# Patient Record
Sex: Male | Born: 1985 | Race: Black or African American | Hispanic: No | Marital: Single | Smoking: Current some day smoker
Health system: Southern US, Community
[De-identification: ages and names within clinical notes are randomized; demographics above are authoritative.]

## PROBLEM LIST (undated history)

## (undated) DIAGNOSIS — T751XXA Unspecified effects of drowning and nonfatal submersion, initial encounter: Secondary | ICD-10-CM

## (undated) DIAGNOSIS — F70 Mild intellectual disabilities: Secondary | ICD-10-CM

## (undated) DIAGNOSIS — J45909 Unspecified asthma, uncomplicated: Secondary | ICD-10-CM

---
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
## 2008-01-07 ENCOUNTER — Emergency Department (HOSPITAL_COMMUNITY): Admission: EM | Admit: 2008-01-07 | Discharge: 2008-01-08 | Payer: Self-pay | Admitting: Emergency Medicine

## 2008-03-20 ENCOUNTER — Emergency Department (HOSPITAL_COMMUNITY): Admission: EM | Admit: 2008-03-20 | Discharge: 2008-03-20 | Payer: Self-pay | Admitting: Family Medicine

## 2008-03-21 ENCOUNTER — Emergency Department (HOSPITAL_COMMUNITY): Admission: EM | Admit: 2008-03-21 | Discharge: 2008-03-21 | Payer: Self-pay | Admitting: Emergency Medicine

## 2013-02-25 ENCOUNTER — Emergency Department (INDEPENDENT_AMBULATORY_CARE_PROVIDER_SITE_OTHER)
Admission: EM | Admit: 2013-02-25 | Discharge: 2013-02-25 | Disposition: A | Discharge status: Home / Self Care | Payer: Medicare Other | Source: Home / Self Care | Attending: Emergency Medicine | Admitting: Emergency Medicine

## 2013-02-25 ENCOUNTER — Encounter (HOSPITAL_COMMUNITY): Payer: Self-pay | Admitting: Emergency Medicine

## 2013-02-25 DIAGNOSIS — J020 Streptococcal pharyngitis: Secondary | ICD-10-CM

## 2013-02-25 HISTORY — DX: Unspecified asthma, uncomplicated: J45.909

## 2013-02-25 LAB — POCT RAPID STREP A: Streptococcus, Group A Screen (Direct): POSITIVE — AB

## 2013-02-25 MED ORDER — AMOXICILLIN 500 MG PO CAPS: 500.0000 mg | ORAL_CAPSULE | Freq: Three times a day (TID) | ORAL | Status: AC
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
## 2013-02-25 NOTE — ED Notes (Addendum)
Pt c/o sore throat onset 2 days... sxs include: odynophagia... Denies: f/v/n/d, cold sxs... He is alert and oriented w/no signs of acute distress and responding well.

## 2015-02-09 ENCOUNTER — Observation Stay (HOSPITAL_COMMUNITY): Payer: Medicare Other

## 2015-02-09 ENCOUNTER — Inpatient Hospital Stay (HOSPITAL_COMMUNITY)
Admission: EM | Admit: 2015-02-09 | Discharge: 2015-02-13 | DRG: 177 | Disposition: A | Discharge status: Home / Self Care | Payer: Medicare Other | Attending: Internal Medicine | Admitting: Internal Medicine

## 2015-02-09 ENCOUNTER — Encounter (HOSPITAL_COMMUNITY): Payer: Self-pay | Admitting: Emergency Medicine

## 2015-02-09 ENCOUNTER — Emergency Department (HOSPITAL_COMMUNITY): Payer: Medicare Other

## 2015-02-09 DIAGNOSIS — R945 Abnormal results of liver function studies: Secondary | ICD-10-CM | POA: Diagnosis present

## 2015-02-09 DIAGNOSIS — T751XXA Unspecified effects of drowning and nonfatal submersion, initial encounter: Secondary | ICD-10-CM | POA: Diagnosis not present

## 2015-02-09 DIAGNOSIS — F17210 Nicotine dependence, cigarettes, uncomplicated: Secondary | ICD-10-CM | POA: Diagnosis present

## 2015-02-09 DIAGNOSIS — R931 Abnormal findings on diagnostic imaging of heart and coronary circulation: Secondary | ICD-10-CM | POA: Diagnosis present

## 2015-02-09 DIAGNOSIS — I469 Cardiac arrest, cause unspecified: Secondary | ICD-10-CM | POA: Diagnosis not present

## 2015-02-09 DIAGNOSIS — I468 Cardiac arrest due to other underlying condition: Secondary | ICD-10-CM | POA: Diagnosis present

## 2015-02-09 DIAGNOSIS — J690 Pneumonitis due to inhalation of food and vomit: Secondary | ICD-10-CM | POA: Diagnosis not present

## 2015-02-09 DIAGNOSIS — R0902 Hypoxemia: Secondary | ICD-10-CM

## 2015-02-09 DIAGNOSIS — J698 Pneumonitis due to inhalation of other solids and liquids: Principal | ICD-10-CM | POA: Diagnosis present

## 2015-02-09 DIAGNOSIS — K7689 Other specified diseases of liver: Secondary | ICD-10-CM | POA: Diagnosis not present

## 2015-02-09 DIAGNOSIS — R0602 Shortness of breath: Secondary | ICD-10-CM

## 2015-02-09 DIAGNOSIS — D72829 Elevated white blood cell count, unspecified: Secondary | ICD-10-CM | POA: Diagnosis present

## 2015-02-09 DIAGNOSIS — N179 Acute kidney failure, unspecified: Secondary | ICD-10-CM

## 2015-02-09 DIAGNOSIS — Y92830 Public park as the place of occurrence of the external cause: Secondary | ICD-10-CM

## 2015-02-09 DIAGNOSIS — I959 Hypotension, unspecified: Secondary | ICD-10-CM | POA: Diagnosis present

## 2015-02-09 DIAGNOSIS — K760 Fatty (change of) liver, not elsewhere classified: Secondary | ICD-10-CM | POA: Diagnosis present

## 2015-02-09 DIAGNOSIS — J45909 Unspecified asthma, uncomplicated: Secondary | ICD-10-CM | POA: Diagnosis present

## 2015-02-09 HISTORY — DX: Mild intellectual disabilities: F70

## 2015-02-09 HISTORY — DX: Unspecified effects of drowning and nonfatal submersion, initial encounter: T75.1XXA

## 2015-02-09 LAB — CBC WITH DIFFERENTIAL/PLATELET
BASOS ABS: 0 10*3/uL (ref 0.0–0.1)
BLASTS: 0 %
Band Neutrophils: 0 % (ref 0–10)
Basophils Relative: 0 % (ref 0–1)
EOS ABS: 0 10*3/uL (ref 0.0–0.7)
Eosinophils Relative: 0 % (ref 0–5)
HCT: 42.6 % (ref 39.0–52.0)
HEMOGLOBIN: 14.2 g/dL (ref 13.0–17.0)
Lymphocytes Relative: 52 % — ABNORMAL HIGH (ref 12–46)
Lymphs Abs: 6.9 10*3/uL — ABNORMAL HIGH (ref 0.7–4.0)
MCH: 26.6 pg (ref 26.0–34.0)
MCHC: 33.3 g/dL (ref 30.0–36.0)
MCV: 79.8 fL (ref 78.0–100.0)
METAMYELOCYTES PCT: 0 %
MONO ABS: 0.5 10*3/uL (ref 0.1–1.0)
MYELOCYTES: 0 %
Monocytes Relative: 4 % (ref 3–12)
Neutro Abs: 5.9 10*3/uL (ref 1.7–7.7)
Neutrophils Relative %: 44 % (ref 43–77)
Other: 0 %
Platelets: 277 10*3/uL (ref 150–400)
Promyelocytes Absolute: 0 %
RBC: 5.34 MIL/uL (ref 4.22–5.81)
RDW: 13 % (ref 11.5–15.5)
WBC: 13.3 10*3/uL — ABNORMAL HIGH (ref 4.0–10.5)
nRBC: 0 /100 WBC

## 2015-02-09 LAB — COMPREHENSIVE METABOLIC PANEL
ALBUMIN: 3.8 g/dL (ref 3.5–5.0)
ALT: 81 U/L — AB (ref 17–63)
AST: 91 U/L — AB (ref 15–41)
Alkaline Phosphatase: 87 U/L (ref 38–126)
Anion gap: 12 (ref 5–15)
BUN: 14 mg/dL (ref 6–20)
CALCIUM: 9.1 mg/dL (ref 8.9–10.3)
CO2: 22 mmol/L (ref 22–32)
CREATININE: 1.4 mg/dL — AB (ref 0.61–1.24)
Chloride: 106 mmol/L (ref 101–111)
GFR calc non Af Amer: >60 mL/min (ref >60)
Glucose, Bld: 147 mg/dL — ABNORMAL HIGH (ref 65–99)
Potassium: 3.5 mmol/L (ref 3.5–5.1)
Sodium: 140 mmol/L (ref 135–145)
TOTAL PROTEIN: 7.1 g/dL (ref 6.5–8.1)
Total Bilirubin: 0.7 mg/dL (ref 0.3–1.2)

## 2015-02-09 LAB — I-STAT CHEM 8, ED
BUN: 19 mg/dL (ref 6–20)
CALCIUM ION: 1.18 mmol/L (ref 1.12–1.23)
Chloride: 102 mmol/L (ref 101–111)
Creatinine, Ser: 1.3 mg/dL — ABNORMAL HIGH (ref 0.61–1.24)
Glucose, Bld: 150 mg/dL — ABNORMAL HIGH (ref 65–99)
HCT: 47 % (ref 39.0–52.0)
Hemoglobin: 16 g/dL (ref 13.0–17.0)
POTASSIUM: 3.4 mmol/L — AB (ref 3.5–5.1)
Sodium: 142 mmol/L (ref 135–145)
TCO2: 20 mmol/L (ref 0–100)

## 2015-02-09 LAB — URINE MICROSCOPIC-ADD ON

## 2015-02-09 LAB — URINALYSIS, ROUTINE W REFLEX MICROSCOPIC
Bilirubin Urine: NEGATIVE
Glucose, UA: >1000 mg/dL — AB
KETONES UR: NEGATIVE mg/dL
LEUKOCYTES UA: NEGATIVE
Nitrite: NEGATIVE
PROTEIN: 100 mg/dL — AB
Specific Gravity, Urine: 1.031 — ABNORMAL HIGH (ref 1.005–1.030)
Urobilinogen, UA: 0.2 mg/dL (ref 0.0–1.0)
pH: 5 (ref 5.0–8.0)

## 2015-02-09 LAB — I-STAT CG4 LACTIC ACID, ED
LACTIC ACID, VENOUS: 5.5 mmol/L — AB (ref 0.5–2.0)
Lactic Acid, Venous: 6.08 mmol/L (ref 0.5–2.0)

## 2015-02-09 LAB — I-STAT TROPONIN, ED: Troponin i, poc: 0 ng/mL (ref 0.00–0.08)

## 2015-02-09 LAB — SODIUM, URINE, RANDOM: SODIUM UR: 170 mmol/L

## 2015-02-09 MED ORDER — SODIUM CHLORIDE 0.9 % IJ SOLN
3.0000 mL | Freq: Two times a day (BID) | INTRAMUSCULAR | Status: DC
  Administered 2015-02-09 – 2015-02-11 (×2): 3 mL via INTRAVENOUS

## 2015-02-09 MED ORDER — ACETAMINOPHEN 650 MG RE SUPP: 650.0000 mg | Freq: Four times a day (QID) | RECTAL | Status: DC | PRN

## 2015-02-09 MED ORDER — ALBUTEROL SULFATE (2.5 MG/3ML) 0.083% IN NEBU
2.5000 mg | INHALATION_SOLUTION | Freq: Four times a day (QID) | RESPIRATORY_TRACT | Status: DC | PRN
Start: 2015-02-09 — End: 2015-02-13
  Administered 2015-02-09: 2.5 mg via RESPIRATORY_TRACT
  Filled 2015-02-09: qty 3

## 2015-02-09 MED ORDER — SODIUM CHLORIDE 0.9 % IV SOLN
INTRAVENOUS | Status: AC
Start: 2015-02-09 — End: 2015-02-10
  Administered 2015-02-09: 22:00:00 via INTRAVENOUS

## 2015-02-09 MED ORDER — SODIUM CHLORIDE 0.9 % IJ SOLN: 3.0000 mL | INTRAMUSCULAR | Status: DC | PRN

## 2015-02-09 MED ORDER — PIPERACILLIN-TAZOBACTAM 4.5 G IVPB: 4.5000 g | Freq: Three times a day (TID) | INTRAVENOUS | Status: DC

## 2015-02-09 MED ORDER — ENOXAPARIN SODIUM 40 MG/0.4ML ~~LOC~~ SOLN
40.0000 mg | Freq: Every day | SUBCUTANEOUS | Status: DC
  Administered 2015-02-10 – 2015-02-13 (×4): 40 mg via SUBCUTANEOUS
  Filled 2015-02-09 (×5): qty 0.4

## 2015-02-09 MED ORDER — SODIUM CHLORIDE 0.9 % IV SOLN
250.0000 mL | INTRAVENOUS | Status: DC | PRN
  Administered 2015-02-10: 250 mL via INTRAVENOUS

## 2015-02-09 MED ORDER — PIPERACILLIN-TAZOBACTAM 3.375 G IVPB
3.3750 g | Freq: Three times a day (TID) | INTRAVENOUS | Status: DC
  Administered 2015-02-09 – 2015-02-13 (×11): 3.375 g via INTRAVENOUS
  Filled 2015-02-09 (×13): qty 50

## 2015-02-09 MED ORDER — ACETAMINOPHEN 325 MG PO TABS
650.0000 mg | ORAL_TABLET | Freq: Four times a day (QID) | ORAL | Status: DC | PRN
  Administered 2015-02-10: 650 mg via ORAL
  Filled 2015-02-09: qty 2

## 2015-02-09 MED ORDER — SODIUM CHLORIDE 0.9 % IJ SOLN
3.0000 mL | Freq: Two times a day (BID) | INTRAMUSCULAR | Status: DC
  Administered 2015-02-09 – 2015-02-11 (×3): 3 mL via INTRAVENOUS

## 2015-02-09 MED ORDER — IPRATROPIUM-ALBUTEROL 0.5-2.5 (3) MG/3ML IN SOLN
3.0000 mL | Freq: Once | RESPIRATORY_TRACT | Status: AC
  Administered 2015-02-09: 3 mL via RESPIRATORY_TRACT
  Filled 2015-02-09: qty 3

## 2015-02-09 NOTE — ED Notes (Signed)
Pt transferred to hospital bed at this time.  
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
## 2015-02-09 NOTE — ED Notes (Signed)
Admitting MD at BS.  

## 2015-02-09 NOTE — ED Notes (Signed)
XRAY called and informed pt is ready for XRAY.  
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
## 2015-02-09 NOTE — ED Notes (Signed)
Pt attempting to give urine sample
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
## 2015-02-10 ENCOUNTER — Observation Stay (HOSPITAL_COMMUNITY): Payer: Medicare Other

## 2015-02-10 ENCOUNTER — Inpatient Hospital Stay (HOSPITAL_COMMUNITY): Payer: Medicare Other

## 2015-02-10 ENCOUNTER — Encounter (HOSPITAL_COMMUNITY): Payer: Self-pay | Admitting: General Practice

## 2015-02-10 DIAGNOSIS — N179 Acute kidney failure, unspecified: Secondary | ICD-10-CM | POA: Diagnosis not present

## 2015-02-10 DIAGNOSIS — K760 Fatty (change of) liver, not elsewhere classified: Secondary | ICD-10-CM | POA: Diagnosis present

## 2015-02-10 DIAGNOSIS — T751XXD Unspecified effects of drowning and nonfatal submersion, subsequent encounter: Secondary | ICD-10-CM

## 2015-02-10 DIAGNOSIS — I469 Cardiac arrest, cause unspecified: Secondary | ICD-10-CM

## 2015-02-10 DIAGNOSIS — I959 Hypotension, unspecified: Secondary | ICD-10-CM | POA: Diagnosis present

## 2015-02-10 DIAGNOSIS — R0902 Hypoxemia: Secondary | ICD-10-CM

## 2015-02-10 DIAGNOSIS — F17210 Nicotine dependence, cigarettes, uncomplicated: Secondary | ICD-10-CM | POA: Diagnosis present

## 2015-02-10 DIAGNOSIS — J45909 Unspecified asthma, uncomplicated: Secondary | ICD-10-CM | POA: Diagnosis present

## 2015-02-10 DIAGNOSIS — I468 Cardiac arrest due to other underlying condition: Secondary | ICD-10-CM | POA: Diagnosis present

## 2015-02-10 DIAGNOSIS — Y92830 Public park as the place of occurrence of the external cause: Secondary | ICD-10-CM | POA: Diagnosis not present

## 2015-02-10 DIAGNOSIS — D72829 Elevated white blood cell count, unspecified: Secondary | ICD-10-CM | POA: Diagnosis present

## 2015-02-10 DIAGNOSIS — R931 Abnormal findings on diagnostic imaging of heart and coronary circulation: Secondary | ICD-10-CM | POA: Diagnosis not present

## 2015-02-10 DIAGNOSIS — T751XXA Unspecified effects of drowning and nonfatal submersion, initial encounter: Secondary | ICD-10-CM | POA: Diagnosis present

## 2015-02-10 DIAGNOSIS — K7689 Other specified diseases of liver: Secondary | ICD-10-CM | POA: Diagnosis not present

## 2015-02-10 DIAGNOSIS — J698 Pneumonitis due to inhalation of other solids and liquids: Secondary | ICD-10-CM | POA: Diagnosis present

## 2015-02-10 DIAGNOSIS — R7989 Other specified abnormal findings of blood chemistry: Secondary | ICD-10-CM | POA: Diagnosis not present

## 2015-02-10 DIAGNOSIS — J690 Pneumonitis due to inhalation of food and vomit: Secondary | ICD-10-CM | POA: Diagnosis not present

## 2015-02-10 LAB — CBC WITH DIFFERENTIAL/PLATELET
BASOS PCT: 0 % (ref 0–1)
Basophils Absolute: 0 10*3/uL (ref 0.0–0.1)
Eosinophils Absolute: 0 10*3/uL (ref 0.0–0.7)
Eosinophils Relative: 0 % (ref 0–5)
HCT: 43 % (ref 39.0–52.0)
HEMOGLOBIN: 14.4 g/dL (ref 13.0–17.0)
Lymphocytes Relative: 5 % — ABNORMAL LOW (ref 12–46)
Lymphs Abs: 1.5 10*3/uL (ref 0.7–4.0)
MCH: 26.7 pg (ref 26.0–34.0)
MCHC: 33.5 g/dL (ref 30.0–36.0)
MCV: 79.8 fL (ref 78.0–100.0)
MONO ABS: 1.1 10*3/uL — AB (ref 0.1–1.0)
Monocytes Relative: 4 % (ref 3–12)
NEUTROS ABS: 24.7 10*3/uL — AB (ref 1.7–7.7)
NEUTROS PCT: 91 % — AB (ref 43–77)
Platelets: UNDETERMINED 10*3/uL (ref 150–400)
RBC: 5.39 MIL/uL (ref 4.22–5.81)
RDW: 13.4 % (ref 11.5–15.5)
WBC: 27.2 10*3/uL — ABNORMAL HIGH (ref 4.0–10.5)

## 2015-02-10 LAB — RAPID URINE DRUG SCREEN, HOSP PERFORMED
Amphetamines: NOT DETECTED
Barbiturates: NOT DETECTED
Benzodiazepines: NOT DETECTED
Cocaine: NOT DETECTED
Opiates: NOT DETECTED
TETRAHYDROCANNABINOL: NOT DETECTED

## 2015-02-10 LAB — COMPREHENSIVE METABOLIC PANEL
ALBUMIN: 3.6 g/dL (ref 3.5–5.0)
ALK PHOS: 67 U/L (ref 38–126)
ALT: 82 U/L — AB (ref 17–63)
AST: 77 U/L — ABNORMAL HIGH (ref 15–41)
Anion gap: 11 (ref 5–15)
BUN: 12 mg/dL (ref 6–20)
CHLORIDE: 104 mmol/L (ref 101–111)
CO2: 24 mmol/L (ref 22–32)
CREATININE: 1.35 mg/dL — AB (ref 0.61–1.24)
Calcium: 9 mg/dL (ref 8.9–10.3)
GFR calc Af Amer: >60 mL/min (ref >60)
Glucose, Bld: 122 mg/dL — ABNORMAL HIGH (ref 65–99)
Potassium: 4.4 mmol/L (ref 3.5–5.1)
SODIUM: 139 mmol/L (ref 135–145)
TOTAL PROTEIN: 6.9 g/dL (ref 6.5–8.1)
Total Bilirubin: 0.8 mg/dL (ref 0.3–1.2)

## 2015-02-10 LAB — I-STAT ARTERIAL BLOOD GAS, ED
Bicarbonate: 25.8 mEq/L — ABNORMAL HIGH (ref 20.0–24.0)
O2 Saturation: 90 %
PCO2 ART: 45.9 mmHg — AB (ref 35.0–45.0)
TCO2: 27 mmol/L (ref 0–100)
pH, Arterial: 7.357 (ref 7.350–7.450)
pO2, Arterial: 61 mmHg — ABNORMAL LOW (ref 80.0–100.0)

## 2015-02-10 LAB — TROPONIN I
TROPONIN I: 0.06 ng/mL — AB (ref <0.031)
Troponin I: 0.05 ng/mL — ABNORMAL HIGH (ref <0.031)

## 2015-02-10 LAB — MRSA PCR SCREENING: MRSA by PCR: NEGATIVE

## 2015-02-10 LAB — BRAIN NATRIURETIC PEPTIDE: B Natriuretic Peptide: 32.2 pg/mL (ref 0.0–100.0)

## 2015-02-10 LAB — PATHOLOGIST SMEAR REVIEW: Path Review: REACTIVE

## 2015-02-10 LAB — ETHANOL

## 2015-02-10 MED ORDER — ONDANSETRON HCL 4 MG/2ML IJ SOLN
4.0000 mg | Freq: Four times a day (QID) | INTRAMUSCULAR | Status: DC | PRN
Start: 2015-02-10 — End: 2015-02-13

## 2015-02-10 MED ORDER — IBUPROFEN 800 MG PO TABS
800.0000 mg | ORAL_TABLET | Freq: Three times a day (TID) | ORAL | Status: DC
  Administered 2015-02-10 – 2015-02-13 (×9): 800 mg via ORAL
  Filled 2015-02-10 (×9): qty 1

## 2015-02-10 MED ORDER — PANTOPRAZOLE SODIUM 40 MG PO TBEC
40.0000 mg | DELAYED_RELEASE_TABLET | Freq: Every day | ORAL | Status: DC
  Administered 2015-02-10 – 2015-02-13 (×4): 40 mg via ORAL
  Filled 2015-02-10 (×4): qty 1

## 2015-02-10 MED ORDER — PNEUMOCOCCAL VAC POLYVALENT 25 MCG/0.5ML IJ INJ
0.5000 mL | INJECTION | INTRAMUSCULAR | Status: AC
  Administered 2015-02-11: 0.5 mL via INTRAMUSCULAR
  Filled 2015-02-10: qty 0.5
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
## 2015-02-10 NOTE — ED Notes (Signed)
Talked to patient's physician with concern about patient's tachypnea and high oxygen demand.  Patient's RR is 37-38 and are somewhat shallow, lung sounds are diminished.  RT has been contacted as well.  Chest xray and ABG order.
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
## 2015-02-10 NOTE — Progress Notes (Signed)
EEG completed; results pending.    
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
## 2015-02-10 NOTE — ED Notes (Signed)
Physician at bedside.

## 2015-02-10 NOTE — ED Provider Notes (Signed)
AM MD update: Patient sitting upright, speaking clearly, describing chest tightness. O2- 98% w [place], abnormal.  He is awaiting bed placement.   Gerhard Munchobert Dorene Bruni, MD 02/10/15 [phone]

## 2015-02-10 NOTE — ED Notes (Signed)
Pt had two episodes of emesis with increased HR in 130s. Dr. Selena BattenKim paged for antiemetic.

## 2015-02-10 NOTE — ED Notes (Signed)
Patient will be transferred from POD D34 to POD C 29. Phlebotomy and respiratory at bedside. Will be transported to x-ray then POD C.

## 2015-02-11 ENCOUNTER — Other Ambulatory Visit: Payer: Self-pay | Admitting: Nurse Practitioner

## 2015-02-11 ENCOUNTER — Inpatient Hospital Stay (HOSPITAL_COMMUNITY): Payer: Medicare Other

## 2015-02-11 ENCOUNTER — Encounter (HOSPITAL_COMMUNITY): Payer: Self-pay | Admitting: Nurse Practitioner

## 2015-02-11 DIAGNOSIS — R931 Abnormal findings on diagnostic imaging of heart and coronary circulation: Secondary | ICD-10-CM

## 2015-02-11 DIAGNOSIS — I469 Cardiac arrest, cause unspecified: Secondary | ICD-10-CM

## 2015-02-11 DIAGNOSIS — I420 Dilated cardiomyopathy: Secondary | ICD-10-CM

## 2015-02-11 DIAGNOSIS — R0902 Hypoxemia: Secondary | ICD-10-CM | POA: Diagnosis present

## 2015-02-11 DIAGNOSIS — R7989 Other specified abnormal findings of blood chemistry: Secondary | ICD-10-CM

## 2015-02-11 DIAGNOSIS — J698 Pneumonitis due to inhalation of other solids and liquids: Secondary | ICD-10-CM | POA: Diagnosis not present

## 2015-02-11 LAB — CALCIUM / CREATININE RATIO, URINE
CALCIUM/CREAT. RATIO: 48 mg/g{creat} (ref 0–260)
Calcium, Ur: 12.5 mg/dL
Creatinine, Urine: 260.1 mg/dL

## 2015-02-11 LAB — CBC WITH DIFFERENTIAL/PLATELET
Basophils Absolute: 0 10*3/uL (ref 0.0–0.1)
Basophils Relative: 0 % (ref 0–1)
Eosinophils Absolute: 0.1 10*3/uL (ref 0.0–0.7)
Eosinophils Relative: 1 % (ref 0–5)
HCT: 37.1 % — ABNORMAL LOW (ref 39.0–52.0)
Hemoglobin: 12.5 g/dL — ABNORMAL LOW (ref 13.0–17.0)
LYMPHS ABS: 3.4 10*3/uL (ref 0.7–4.0)
Lymphocytes Relative: 25 % (ref 12–46)
MCH: 27.2 pg (ref 26.0–34.0)
MCHC: 33.7 g/dL (ref 30.0–36.0)
MCV: 80.7 fL (ref 78.0–100.0)
MONOS PCT: 8 % (ref 3–12)
Monocytes Absolute: 1.1 10*3/uL — ABNORMAL HIGH (ref 0.1–1.0)
Neutro Abs: 9.2 10*3/uL — ABNORMAL HIGH (ref 1.7–7.7)
Neutrophils Relative %: 66 % (ref 43–77)
Platelets: 232 10*3/uL (ref 150–400)
RBC: 4.6 MIL/uL (ref 4.22–5.81)
RDW: 13.6 % (ref 11.5–15.5)
WBC: 13.9 10*3/uL — ABNORMAL HIGH (ref 4.0–10.5)

## 2015-02-11 LAB — COMPREHENSIVE METABOLIC PANEL
ALT: 61 U/L (ref 17–63)
AST: 67 U/L — ABNORMAL HIGH (ref 15–41)
Albumin: 3.4 g/dL — ABNORMAL LOW (ref 3.5–5.0)
Alkaline Phosphatase: 56 U/L (ref 38–126)
Anion gap: 9 (ref 5–15)
BUN: 8 mg/dL (ref 6–20)
CO2: 28 mmol/L (ref 22–32)
CREATININE: 1.19 mg/dL (ref 0.61–1.24)
Calcium: 8.6 mg/dL — ABNORMAL LOW (ref 8.9–10.3)
Chloride: 99 mmol/L — ABNORMAL LOW (ref 101–111)
GFR calc Af Amer: >60 mL/min (ref >60)
GFR calc non Af Amer: >60 mL/min (ref >60)
Glucose, Bld: 106 mg/dL — ABNORMAL HIGH (ref 65–99)
Potassium: 3.3 mmol/L — ABNORMAL LOW (ref 3.5–5.1)
SODIUM: 136 mmol/L (ref 135–145)
TOTAL PROTEIN: 6.9 g/dL (ref 6.5–8.1)
Total Bilirubin: 1.5 mg/dL — ABNORMAL HIGH (ref 0.3–1.2)

## 2015-02-11 MED ORDER — PERFLUTREN LIPID MICROSPHERE
1.0000 mL | INTRAVENOUS | Status: AC | PRN
  Administered 2015-02-11: 3 mL via INTRAVENOUS
  Filled 2015-02-11: qty 10

## 2015-02-11 MED ORDER — POTASSIUM CHLORIDE CRYS ER 20 MEQ PO TBCR
40.0000 meq | EXTENDED_RELEASE_TABLET | ORAL | Status: AC
  Administered 2015-02-11 (×2): 40 meq via ORAL
  Filled 2015-02-11 (×2): qty 2

## 2015-02-11 MED ORDER — FUROSEMIDE 10 MG/ML IJ SOLN
20.0000 mg | Freq: Once | INTRAMUSCULAR | Status: AC
  Administered 2015-02-11: 20 mg via INTRAVENOUS
  Filled 2015-02-11: qty 2

## 2015-02-11 NOTE — Clinical Documentation Improvement (Signed)
Possible Clinical Conditions?  Acute Respiratory Failure Acute on Chronic Respiratory Failure Chronic Respiratory Failure Acute Respiratory Insufficiency Acute Respiratory Insufficiency following surgery or trauma Other Condition Cannot Clinically Determine   Supporting Information:(As per notes) "Patient was noted to be hypoxic with frequent coughing and on presentation to the ED his sats were 70-75%. "  "RN tried to wean pt off of 02 throughout the shift. Pt stayed on RA for 5 minutes, Sats dropped to 90%. RN replaced 0@," Respiratory:"He has rales. Slight crackles right mid lung "  Treatment: Oxygen via Non-Rebreather Mask  Medications:    albuterol (PROVENTIL) (2.5 MG/3ML) 0.083% nebulizer solution 2.5 mg   [[id number]][[id number]]       Ordered Dose: 2.5 mg Route: Nebulization Frequency: Every 6 hours PRN   albuterol (PROVENTIL HFA;VENTOLIN HFA) 108 (90 BASE) MCG/ACT inhaler [[id number]][[id number]]        Order Details      Dose: 1-2 puff Route: Inhalation Frequency: Every 6 hours PRN     Thank You, Nevin BloodgoodJoan B Simrit Gohlke, RN, BSN, CCDS,Clinical Documentation Specialist:  [phone]  [phone]=Cell [place]- Health Information Management
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
## 2015-02-11 NOTE — Progress Notes (Signed)
  Echocardiogram 2D Echocardiogram has been performed.  Victor [name] [name] 02/11/2015, 9:23 AM
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
## 2015-02-11 NOTE — Progress Notes (Signed)
SATURATION QUALIFICATIONS: (This note is used to comply with regulatory documentation for home oxygen)  Patient Saturations on Room Air at Rest = 85%  Patient Saturations on Room Air while Ambulating = 84%  Patient Saturations on 2 Liters of oxygen while Ambulating = 5%  Please briefly explain why patient needs home oxygen:Ambulating pt without oxygen and sats down to 84% on RA but improved to the mid to upper 90s on 2l [place]

## 2015-02-12 LAB — COMPREHENSIVE METABOLIC PANEL
ALT: 49 U/L (ref 17–63)
ANION GAP: 8 (ref 5–15)
AST: 54 U/L — ABNORMAL HIGH (ref 15–41)
Albumin: 3.4 g/dL — ABNORMAL LOW (ref 3.5–5.0)
Alkaline Phosphatase: 61 U/L (ref 38–126)
BUN: 8 mg/dL (ref 6–20)
CALCIUM: 8.7 mg/dL — AB (ref 8.9–10.3)
CO2: 29 mmol/L (ref 22–32)
Chloride: 99 mmol/L — ABNORMAL LOW (ref 101–111)
Creatinine, Ser: 1.08 mg/dL (ref 0.61–1.24)
GFR calc Af Amer: >60 mL/min (ref >60)
GLUCOSE: 111 mg/dL — AB (ref 65–99)
Potassium: 3.5 mmol/L (ref 3.5–5.1)
Sodium: 136 mmol/L (ref 135–145)
Total Bilirubin: 0.8 mg/dL (ref 0.3–1.2)
Total Protein: 6.8 g/dL (ref 6.5–8.1)

## 2015-02-12 LAB — CBC WITH DIFFERENTIAL/PLATELET
Basophils Absolute: 0 10*3/uL (ref 0.0–0.1)
Basophils Relative: 0 % (ref 0–1)
EOS PCT: 2 % (ref 0–5)
Eosinophils Absolute: 0.2 10*3/uL (ref 0.0–0.7)
HCT: 37.2 % — ABNORMAL LOW (ref 39.0–52.0)
HEMOGLOBIN: 12.1 g/dL — AB (ref 13.0–17.0)
LYMPHS PCT: 25 % (ref 12–46)
Lymphs Abs: 3 10*3/uL (ref 0.7–4.0)
MCH: 26.2 pg (ref 26.0–34.0)
MCHC: 32.5 g/dL (ref 30.0–36.0)
MCV: 80.7 fL (ref 78.0–100.0)
MONOS PCT: 7 % (ref 3–12)
Monocytes Absolute: 0.8 10*3/uL (ref 0.1–1.0)
NEUTROS ABS: 7.8 10*3/uL — AB (ref 1.7–7.7)
NEUTROS PCT: 66 % (ref 43–77)
Platelets: 219 10*3/uL (ref 150–400)
RBC: 4.61 MIL/uL (ref 4.22–5.81)
RDW: 13.3 % (ref 11.5–15.5)
WBC: 11.8 10*3/uL — ABNORMAL HIGH (ref 4.0–10.5)

## 2015-02-12 MED ORDER — POTASSIUM CHLORIDE CRYS ER 20 MEQ PO TBCR
40.0000 meq | EXTENDED_RELEASE_TABLET | Freq: Once | ORAL | Status: AC
  Administered 2015-02-12: 40 meq via ORAL
  Filled 2015-02-12: qty 2

## 2015-02-12 MED ORDER — FUROSEMIDE 10 MG/ML IJ SOLN
20.0000 mg | Freq: Once | INTRAMUSCULAR | Status: AC
  Administered 2015-02-12: 20 mg via INTRAVENOUS
  Filled 2015-02-12: qty 2
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
## 2015-02-13 LAB — BASIC METABOLIC PANEL
Anion gap: 8 (ref 5–15)
BUN: 9 mg/dL (ref 6–20)
CHLORIDE: 99 mmol/L — AB (ref 101–111)
CO2: 30 mmol/L (ref 22–32)
CREATININE: 1.18 mg/dL (ref 0.61–1.24)
Calcium: 8.9 mg/dL (ref 8.9–10.3)
GFR calc Af Amer: >60 mL/min (ref >60)
Glucose, Bld: 96 mg/dL (ref 65–99)
Potassium: 3.4 mmol/L — ABNORMAL LOW (ref 3.5–5.1)
SODIUM: 137 mmol/L (ref 135–145)

## 2015-02-13 LAB — CBC
HCT: 37.6 % — ABNORMAL LOW (ref 39.0–52.0)
Hemoglobin: 12.5 g/dL — ABNORMAL LOW (ref 13.0–17.0)
MCH: 26.5 pg (ref 26.0–34.0)
MCHC: 33.2 g/dL (ref 30.0–36.0)
MCV: 79.8 fL (ref 78.0–100.0)
PLATELETS: 252 10*3/uL (ref 150–400)
RBC: 4.71 MIL/uL (ref 4.22–5.81)
RDW: 12.9 % (ref 11.5–15.5)
WBC: 10.8 10*3/uL — AB (ref 4.0–10.5)

## 2015-02-13 MED ORDER — FUROSEMIDE 10 MG/ML IJ SOLN
20.0000 mg | Freq: Once | INTRAMUSCULAR | Status: AC
  Administered 2015-02-13: 20 mg via INTRAVENOUS
  Filled 2015-02-13: qty 2

## 2015-02-13 MED ORDER — AMOXICILLIN-POT CLAVULANATE 875-125 MG PO TABS
1.0000 | ORAL_TABLET | Freq: Two times a day (BID) | ORAL | Status: DC
  Administered 2015-02-13: 1 via ORAL
  Filled 2015-02-13: qty 1

## 2015-02-13 MED ORDER — PREDNISONE 20 MG PO TABS
60.0000 mg | ORAL_TABLET | Freq: Once | ORAL | Status: AC
  Administered 2015-02-13: 60 mg via ORAL
  Filled 2015-02-13: qty 3

## 2015-02-13 MED ORDER — POTASSIUM CHLORIDE CRYS ER 20 MEQ PO TBCR
40.0000 meq | EXTENDED_RELEASE_TABLET | Freq: Once | ORAL | Status: AC
  Administered 2015-02-13: 40 meq via ORAL
  Filled 2015-02-13: qty 2

## 2015-02-13 MED ORDER — IBUPROFEN 800 MG PO TABS: 800.0000 mg | ORAL_TABLET | Freq: Three times a day (TID) | ORAL | Status: DC

## 2015-02-13 MED ORDER — AMOXICILLIN-POT CLAVULANATE 875-125 MG PO TABS: 1.0000 | ORAL_TABLET | Freq: Two times a day (BID) | ORAL | Status: AC

## 2015-02-13 NOTE — Progress Notes (Signed)
Ambulated pt in the hallway.  O2 sats started at 100% room air while resting.  While ambulating pt O2 sats remained around 91-93% room air with no shortness of breath. Will continue to monitor.
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
## 2015-02-14 MED FILL — Perflutren Lipid Microsphere IV Susp 1.1 MG/ML: INTRAVENOUS | Qty: 10 | Status: AC

## 2015-02-16 LAB — CULTURE, BLOOD (ROUTINE X 2)
CULTURE: NO GROWTH
Culture: NO GROWTH

## 2015-02-23 ENCOUNTER — Ambulatory Visit: Payer: Medicare Other | Attending: Internal Medicine | Admitting: Internal Medicine

## 2015-02-23 ENCOUNTER — Encounter: Payer: Self-pay | Admitting: Internal Medicine

## 2015-02-23 VITALS — BP 123/78 | HR 105 | Temp 98.0°F | Resp 16 | Wt 233.0 lb

## 2015-02-23 DIAGNOSIS — Z8674 Personal history of sudden cardiac arrest: Secondary | ICD-10-CM | POA: Diagnosis present

## 2015-02-23 DIAGNOSIS — D72829 Elevated white blood cell count, unspecified: Secondary | ICD-10-CM | POA: Insufficient documentation

## 2015-02-23 DIAGNOSIS — E876 Hypokalemia: Secondary | ICD-10-CM | POA: Insufficient documentation

## 2015-02-23 DIAGNOSIS — J690 Pneumonitis due to inhalation of food and vomit: Secondary | ICD-10-CM | POA: Insufficient documentation

## 2015-02-23 DIAGNOSIS — Z8709 Personal history of other diseases of the respiratory system: Secondary | ICD-10-CM | POA: Diagnosis not present

## 2015-02-23 LAB — CBC WITH DIFFERENTIAL/PLATELET
BASOS ABS: 0 10*3/uL (ref 0.0–0.1)
BASOS PCT: 0 % (ref 0–1)
Eosinophils Absolute: 0.2 10*3/uL (ref 0.0–0.7)
Eosinophils Relative: 2 % (ref 0–5)
HCT: 42.6 % (ref 39.0–52.0)
Hemoglobin: 14.2 g/dL (ref 13.0–17.0)
Lymphocytes Relative: 39 % (ref 12–46)
Lymphs Abs: 3.9 10*3/uL (ref 0.7–4.0)
MCH: 26.5 pg (ref 26.0–34.0)
MCHC: 33.3 g/dL (ref 30.0–36.0)
MCV: 79.5 fL (ref 78.0–100.0)
MPV: 9.5 fL (ref 8.6–12.4)
Monocytes Absolute: 0.8 10*3/uL (ref 0.1–1.0)
Monocytes Relative: 8 % (ref 3–12)
NEUTROS ABS: 5 10*3/uL (ref 1.7–7.7)
NEUTROS PCT: 51 % (ref 43–77)
PLATELETS: 314 10*3/uL (ref 150–400)
RBC: 5.36 MIL/uL (ref 4.22–5.81)
RDW: 14.2 % (ref 11.5–15.5)
WBC: 9.9 10*3/uL (ref 4.0–10.5)

## 2015-02-23 LAB — COMPLETE METABOLIC PANEL WITH GFR
ALBUMIN: 4.4 g/dL (ref 3.5–5.2)
ALT: 70 U/L — ABNORMAL HIGH (ref 0–53)
AST: 43 U/L — ABNORMAL HIGH (ref 0–37)
Alkaline Phosphatase: 79 U/L (ref 39–117)
BUN: 12 mg/dL (ref 6–23)
CALCIUM: 10.3 mg/dL (ref 8.4–10.5)
CHLORIDE: 104 meq/L (ref 96–112)
CO2: 29 mEq/L (ref 19–32)
Creat: 1.1 mg/dL (ref 0.50–1.35)
GLUCOSE: 124 mg/dL — AB (ref 70–99)
Potassium: 4.7 mEq/L (ref 3.5–5.3)
SODIUM: 138 meq/L (ref 135–145)
TOTAL PROTEIN: 7.7 g/dL (ref 6.0–8.3)
Total Bilirubin: 0.5 mg/dL (ref 0.2–1.2)
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
## 2015-02-24 ENCOUNTER — Telehealth: Payer: Self-pay

## 2015-02-24 NOTE — Telephone Encounter (Signed)
Patient is aware of his lab results 

## 2015-02-24 NOTE — Telephone Encounter (Signed)
-----   Message from Doris Cheadle, MD sent at 02/24/2015  9:31 AM EDT ----- Blood work reviewed call and let the patient know that his CBC is improved and has normal WBC count, his potassium level is also improved and is in normal range, noted still has some liver enzyme elevation which can be secondary to fatty liver noted on ultrasound, advise patient to avoid any alcohol intake , will repeat blood chemistry on the following visit if persistently elevated will order hepatitis panel.

## 2015-03-24 ENCOUNTER — Encounter (HOSPITAL_COMMUNITY): Payer: Self-pay | Admitting: Emergency Medicine

## 2015-05-13 ENCOUNTER — Encounter: Payer: Self-pay | Admitting: Cardiovascular Disease

## 2015-05-23 ENCOUNTER — Ambulatory Visit (HOSPITAL_COMMUNITY): Payer: Medicare Other | Attending: Cardiovascular Disease

## 2015-05-23 ENCOUNTER — Other Ambulatory Visit: Payer: Self-pay

## 2015-05-23 ENCOUNTER — Encounter (HOSPITAL_COMMUNITY): Payer: Self-pay

## 2015-05-23 DIAGNOSIS — Z720 Tobacco use: Secondary | ICD-10-CM | POA: Insufficient documentation

## 2015-05-23 DIAGNOSIS — I420 Dilated cardiomyopathy: Secondary | ICD-10-CM | POA: Diagnosis not present

## 2015-05-23 DIAGNOSIS — I358 Other nonrheumatic aortic valve disorders: Secondary | ICD-10-CM | POA: Diagnosis not present

## 2015-05-23 DIAGNOSIS — Z6835 Body mass index (BMI) 35.0-35.9, adult: Secondary | ICD-10-CM | POA: Insufficient documentation

## 2015-05-23 DIAGNOSIS — I429 Cardiomyopathy, unspecified: Secondary | ICD-10-CM | POA: Insufficient documentation

## 2015-05-23 DIAGNOSIS — E669 Obesity, unspecified: Secondary | ICD-10-CM | POA: Diagnosis not present

## 2015-05-23 MED ORDER — PERFLUTREN LIPID MICROSPHERE
1.0000 mL | INTRAVENOUS | Status: AC | PRN
  Administered 2015-05-23: 3 mL via INTRAVENOUS
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
## 2015-05-30 ENCOUNTER — Ambulatory Visit: Payer: Self-pay | Admitting: Cardiovascular Disease

## 2015-06-02 ENCOUNTER — Encounter: Payer: Self-pay | Admitting: Cardiovascular Disease

## 2015-06-02 ENCOUNTER — Ambulatory Visit (INDEPENDENT_AMBULATORY_CARE_PROVIDER_SITE_OTHER): Payer: Medicare Other | Admitting: Cardiovascular Disease

## 2015-06-02 VITALS — BP 122/98 | HR 63 | Ht 68.0 in | Wt 237.6 lb

## 2015-06-02 DIAGNOSIS — I469 Cardiac arrest, cause unspecified: Secondary | ICD-10-CM

## 2015-06-02 DIAGNOSIS — R931 Abnormal findings on diagnostic imaging of heart and coronary circulation: Secondary | ICD-10-CM | POA: Diagnosis not present
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
## 2015-06-02 NOTE — Patient Instructions (Signed)
Medication Instructions:  Your physician recommends that you continue on your current medications as directed. Please refer to the Current Medication list given to you today.   Labwork: NONE ORDER TODAY    Testing/Procedures: NONE ORDER TODAY    Follow-Up:  Call or return to clinic  As needed  if  Cardiac related symptoms worsen or fail to improve as anticipated.    Any Other Special Instructions Will Be Listed Below (If Applicable).

## 2020-03-06 ENCOUNTER — Other Ambulatory Visit: Payer: Self-pay

## 2020-03-06 ENCOUNTER — Encounter (HOSPITAL_BASED_OUTPATIENT_CLINIC_OR_DEPARTMENT_OTHER): Payer: Self-pay | Admitting: Emergency Medicine

## 2020-03-06 ENCOUNTER — Emergency Department (HOSPITAL_BASED_OUTPATIENT_CLINIC_OR_DEPARTMENT_OTHER)
Admission: EM | Admit: 2020-03-06 | Discharge: 2020-03-06 | Disposition: A | Discharge status: Home / Self Care | Payer: Medicare Other | Attending: Emergency Medicine | Admitting: Emergency Medicine

## 2020-03-06 DIAGNOSIS — F17210 Nicotine dependence, cigarettes, uncomplicated: Secondary | ICD-10-CM | POA: Insufficient documentation

## 2020-03-06 DIAGNOSIS — R05 Cough: Secondary | ICD-10-CM | POA: Diagnosis not present

## 2020-03-06 DIAGNOSIS — J45909 Unspecified asthma, uncomplicated: Secondary | ICD-10-CM | POA: Insufficient documentation

## 2020-03-06 DIAGNOSIS — R059 Cough, unspecified: Secondary | ICD-10-CM

## 2020-03-06 MED ORDER — LORATADINE 10 MG PO TABS: 10.0000 mg | ORAL_TABLET | Freq: Every day | ORAL | 1 refills | Status: AC

## 2020-03-06 MED ORDER — FLUTICASONE PROPIONATE 50 MCG/ACT NA SUSP: 2.0000 | Freq: Every day | NASAL | 1 refills | Status: AC

## 2020-03-06 MED ORDER — ALBUTEROL SULFATE HFA 108 (90 BASE) MCG/ACT IN AERS: 2.0000 | INHALATION_SPRAY | RESPIRATORY_TRACT | 2 refills | Status: AC | PRN

## 2020-03-06 MED ORDER — OMEPRAZOLE 20 MG PO CPDR: 20.0000 mg | DELAYED_RELEASE_CAPSULE | Freq: Two times a day (BID) | ORAL | 1 refills | Status: AC

## 2020-03-06 NOTE — ED Triage Notes (Signed)
Pt here with non-productive cough since March.

## 2020-03-06 NOTE — Discharge Instructions (Signed)
1.  Reflux is a common cause of chronic cough.  Please review the discharge instructions regarding reflux with dietary measures and other lifestyle changes. 2.  Asthma also can cause chronic cough.  You do have a history of asthma.  At this time you are not having wheezing or signs of difficulty breathing.  Do not smoke.  See your doctor for recheck for continued monitoring your asthma and determination if you should have chronic baseline management medications. 3.  You have been given a prescription for Flonase and Claritin.  Seasonal allergies are also common cause for cough.  Take these as prescribed. 4.  Return to the emergency department if you have any worsening symptoms such as fever, chest pain, shortness of breath or other concerning symptoms.
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
## 2020-08-02 ENCOUNTER — Other Ambulatory Visit: Payer: Self-pay

## 2020-08-02 ENCOUNTER — Ambulatory Visit (HOSPITAL_COMMUNITY)
Admission: EM | Admit: 2020-08-02 | Discharge: 2020-08-02 | Disposition: A | Discharge status: Home / Self Care | Payer: Medicare Other | Attending: Emergency Medicine | Admitting: Emergency Medicine

## 2020-08-02 ENCOUNTER — Encounter (HOSPITAL_COMMUNITY): Payer: Self-pay | Admitting: Emergency Medicine

## 2020-08-02 ENCOUNTER — Ambulatory Visit (INDEPENDENT_AMBULATORY_CARE_PROVIDER_SITE_OTHER): Payer: Medicare Other

## 2020-08-02 DIAGNOSIS — M545 Low back pain, unspecified: Secondary | ICD-10-CM | POA: Diagnosis not present

## 2020-08-02 DIAGNOSIS — W19XXXA Unspecified fall, initial encounter: Secondary | ICD-10-CM | POA: Diagnosis not present

## 2020-08-02 DIAGNOSIS — S39012A Strain of muscle, fascia and tendon of lower back, initial encounter: Secondary | ICD-10-CM | POA: Diagnosis present

## 2020-08-02 MED ORDER — NAPROXEN 500 MG PO TABS: 500.0000 mg | ORAL_TABLET | Freq: Two times a day (BID) | ORAL | 0 refills | Status: AC
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
## 2022-08-16 IMAGING — DX DG LUMBAR SPINE COMPLETE 4+V
5 series · 5 of 5 positions shown · non-contrast
Comparison: None.

CLINICAL DATA: Back pain after fall 1 month ago

EXAM:
LUMBAR SPINE - COMPLETE 4+ VIEW

[l-spine ap]
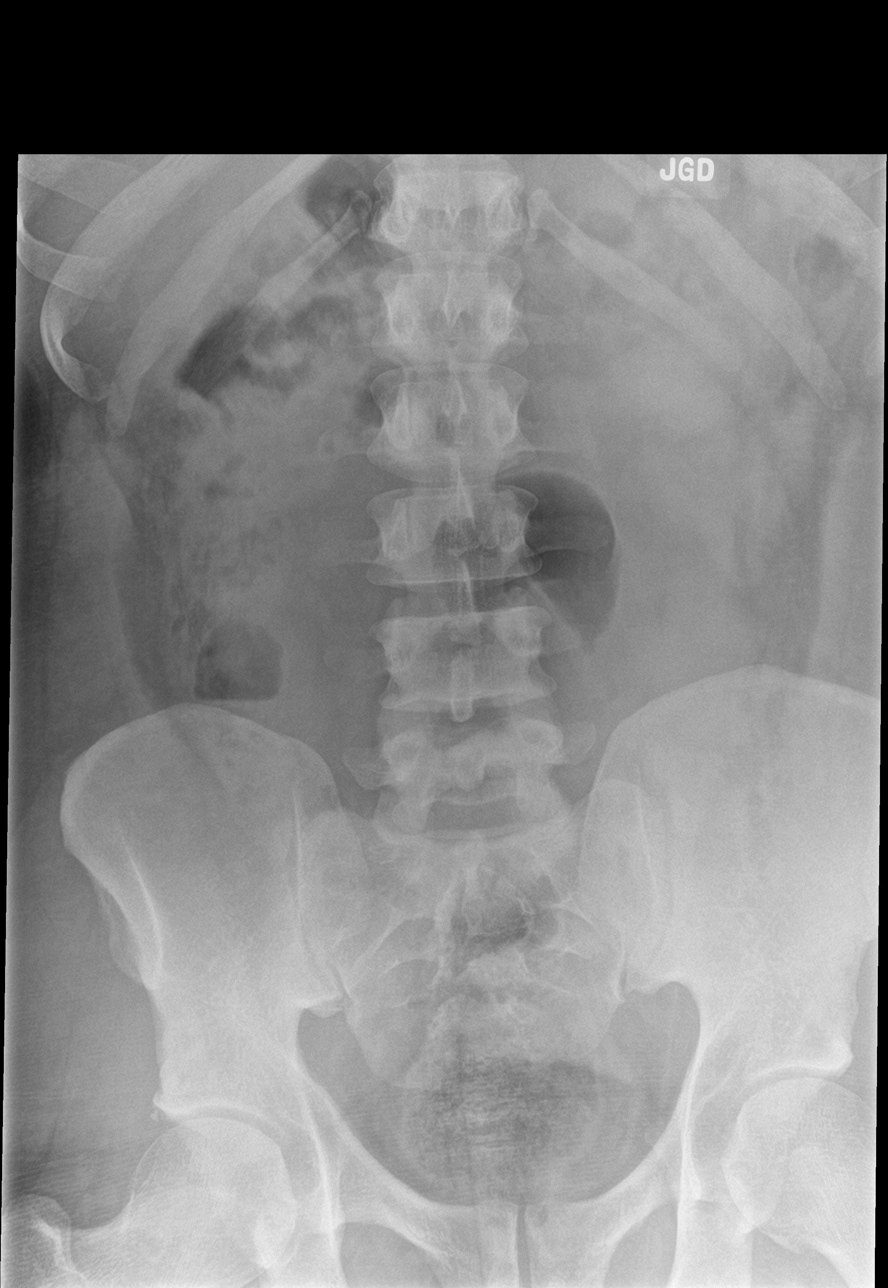

[l-spine obl (1 of 2)]
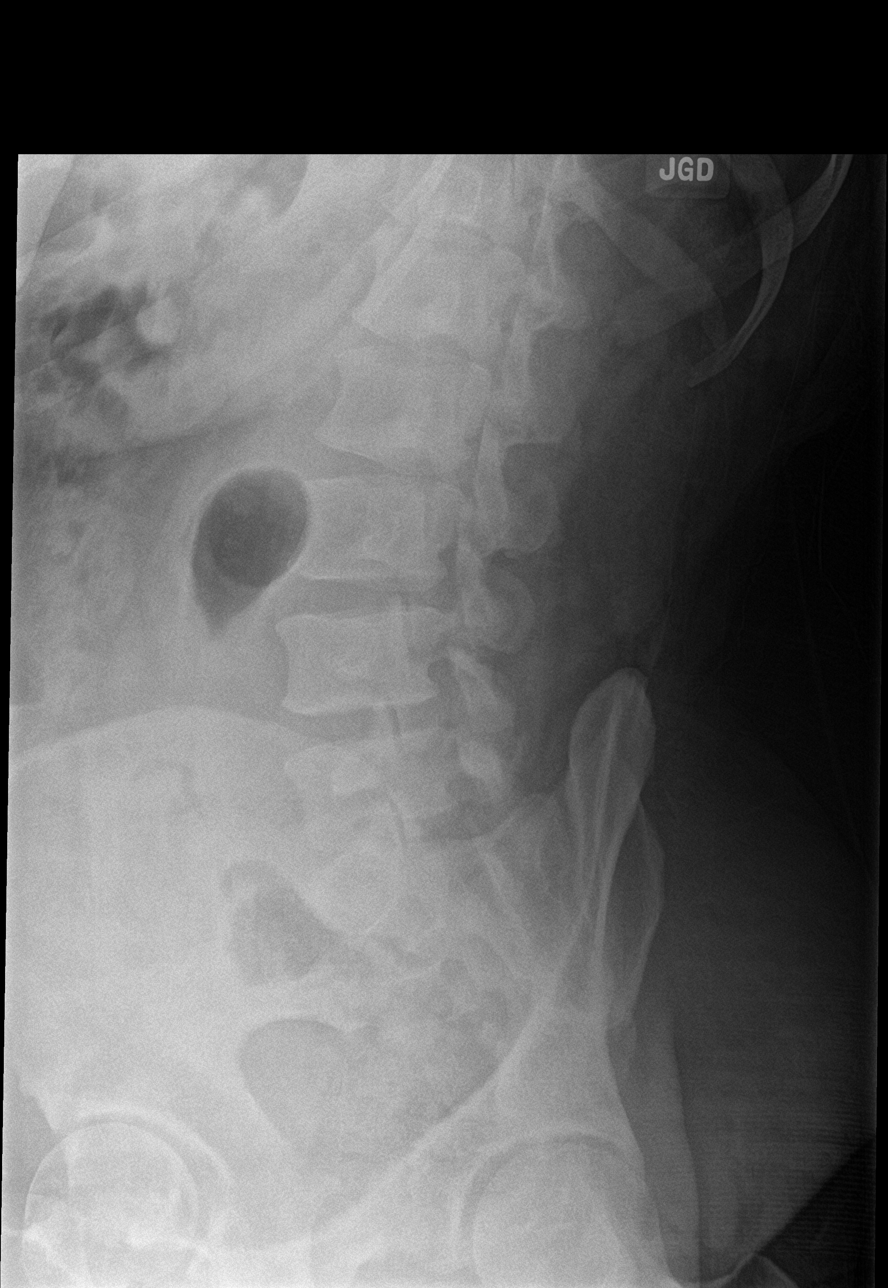

[l-spine obl (2 of 2)]
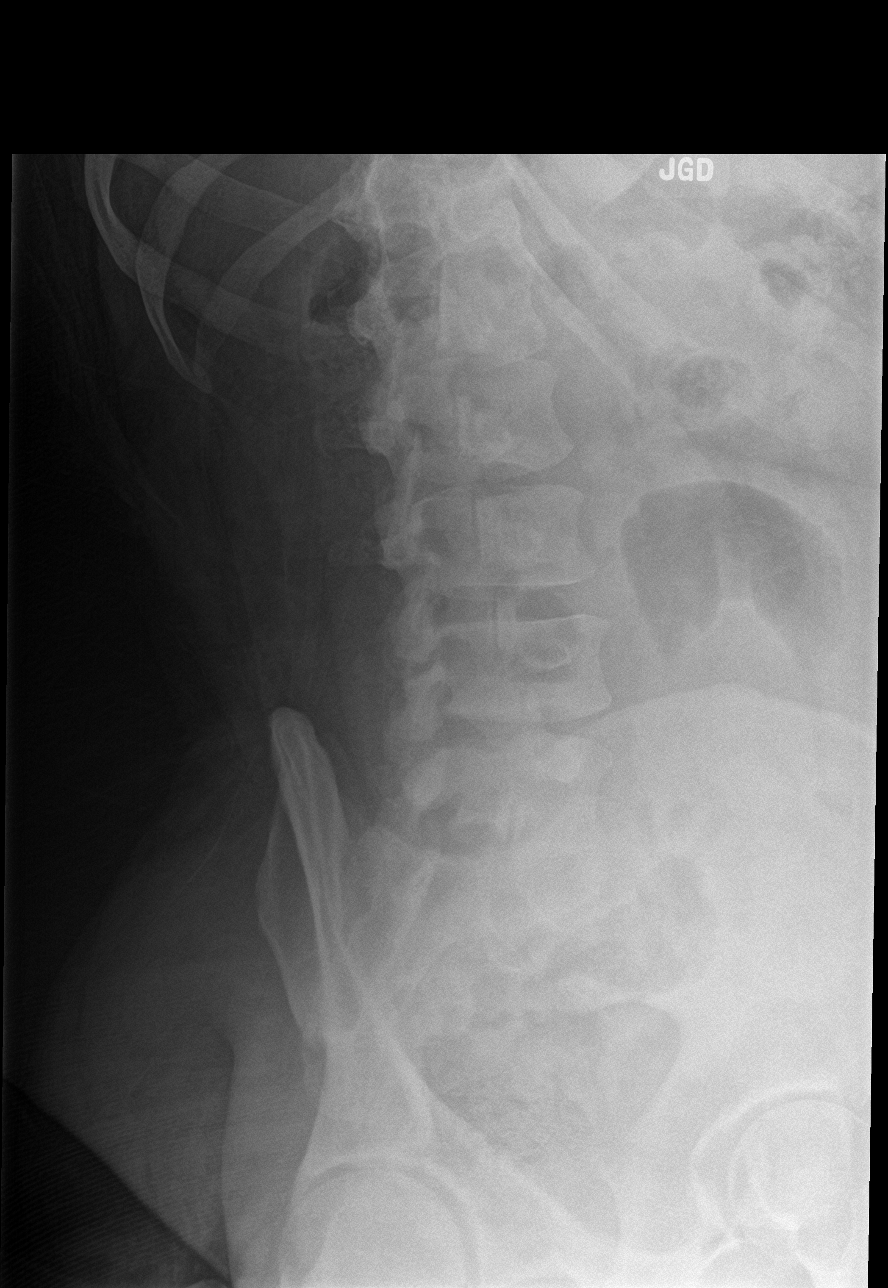

[l-spine lat]
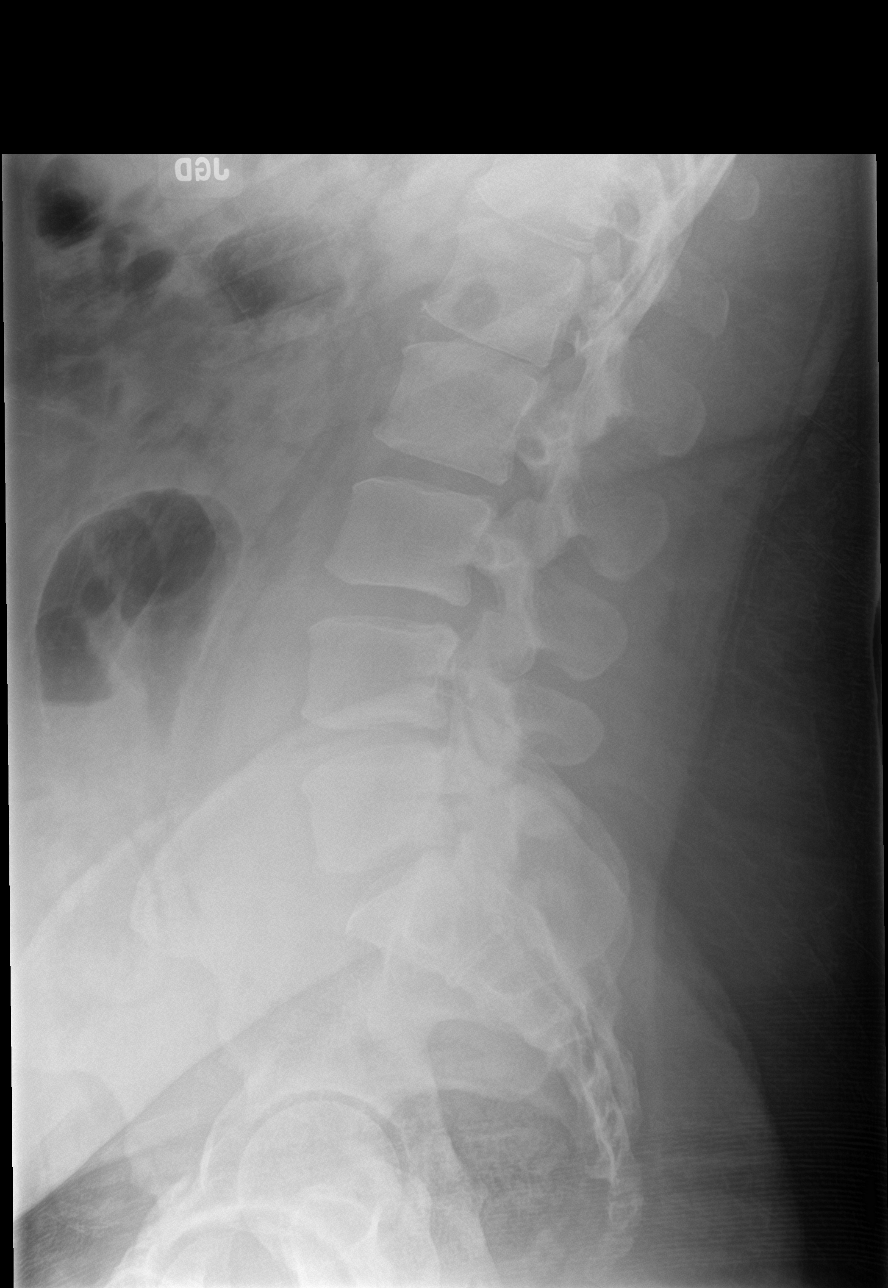

[l-spine spot]
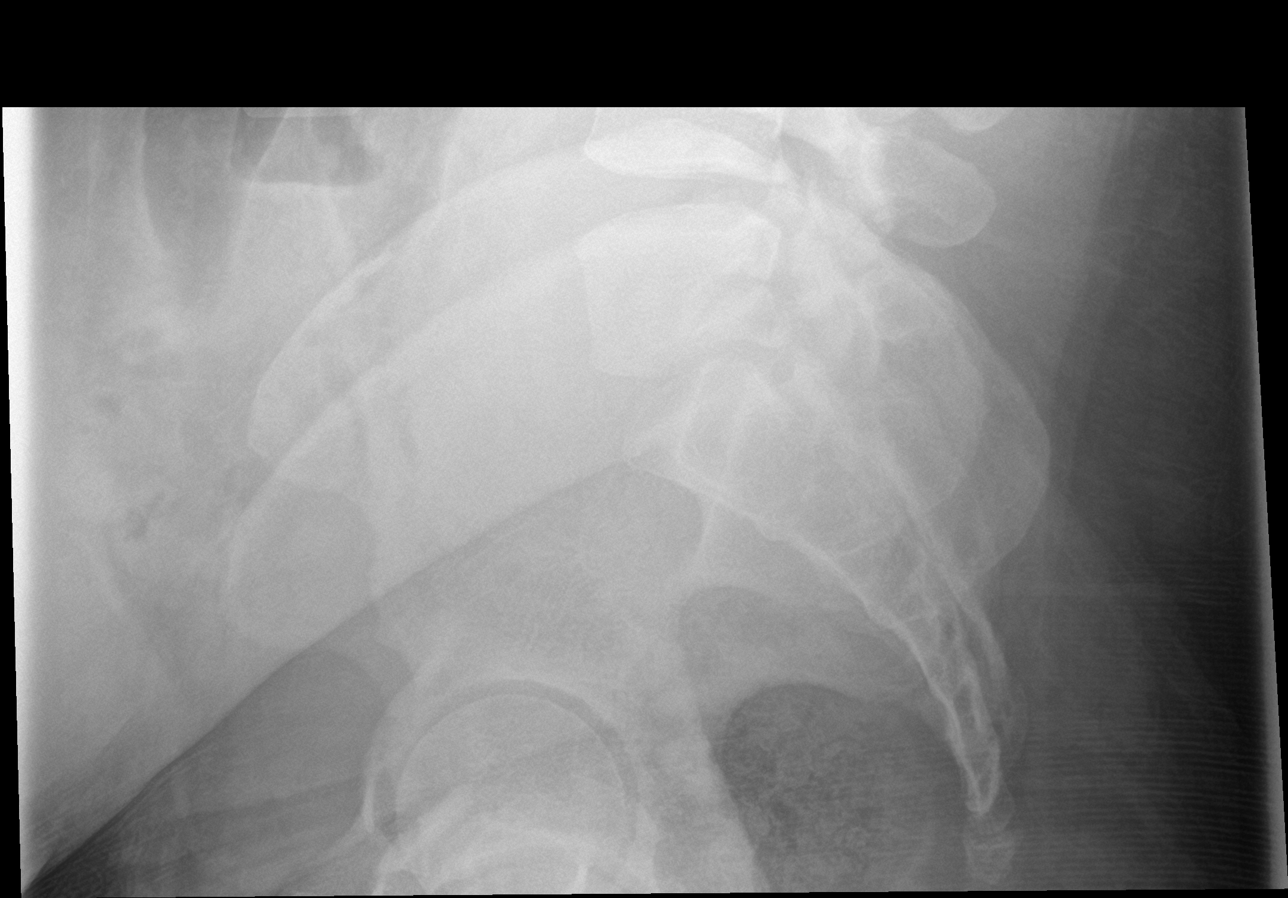

[5 of 5 positions shown; findings below may reference images not displayed]

FINDINGS: There is no evidence of lumbar spine fracture. Vertebral body
heights are maintained. Alignment is normal. Intervertebral disc
spaces are maintained.
IMPRESSION: Negative.
